# Patient Record
Sex: Male | Born: 1975 | Race: White | Hispanic: No | Marital: Married | Smoking: Former smoker
Health system: Southern US, Community
[De-identification: ages and names within clinical notes are randomized; demographics above are authoritative.]

## PROBLEM LIST (undated history)

## (undated) DIAGNOSIS — T7840XA Allergy, unspecified, initial encounter: Secondary | ICD-10-CM

## (undated) DIAGNOSIS — J45909 Unspecified asthma, uncomplicated: Secondary | ICD-10-CM

## (undated) HISTORY — DX: Allergy, unspecified, initial encounter: T78.40XA

---

## 2009-02-15 ENCOUNTER — Encounter: Admission: RE | Admit: 2009-02-15 | Discharge: 2009-02-15 | Payer: Self-pay | Admitting: Family Medicine

## 2010-06-09 ENCOUNTER — Encounter: Admission: RE | Admit: 2010-06-09 | Discharge: 2010-06-09 | Payer: Self-pay | Admitting: Occupational Medicine

## 2013-08-24 ENCOUNTER — Emergency Department (INDEPENDENT_AMBULATORY_CARE_PROVIDER_SITE_OTHER)

## 2013-08-24 ENCOUNTER — Emergency Department (INDEPENDENT_AMBULATORY_CARE_PROVIDER_SITE_OTHER)
Admission: EM | Admit: 2013-08-24 | Discharge: 2013-08-24 | Disposition: A | Discharge status: Home / Self Care | Source: Home / Self Care | Attending: Emergency Medicine | Admitting: Emergency Medicine

## 2013-08-24 ENCOUNTER — Encounter (HOSPITAL_COMMUNITY): Payer: Self-pay | Admitting: Emergency Medicine

## 2013-08-24 DIAGNOSIS — J45901 Unspecified asthma with (acute) exacerbation: Secondary | ICD-10-CM

## 2013-08-24 DIAGNOSIS — R69 Illness, unspecified: Principal | ICD-10-CM

## 2013-08-24 DIAGNOSIS — J111 Influenza due to unidentified influenza virus with other respiratory manifestations: Secondary | ICD-10-CM

## 2013-08-24 DIAGNOSIS — J189 Pneumonia, unspecified organism: Secondary | ICD-10-CM

## 2013-08-24 HISTORY — DX: Unspecified asthma, uncomplicated: J45.909

## 2013-08-24 MED ORDER — CEFTRIAXONE SODIUM 1 G IJ SOLR
1.0000 g | Freq: Once | INTRAMUSCULAR | Status: AC
  Administered 2013-08-24: 1 g via INTRAMUSCULAR

## 2013-08-24 MED ORDER — OSELTAMIVIR PHOSPHATE 75 MG PO CAPS: 75.0000 mg | ORAL_CAPSULE | Freq: Two times a day (BID) | ORAL | Status: DC

## 2013-08-24 MED ORDER — METHYLPREDNISOLONE ACETATE 80 MG/ML IJ SUSP
INTRAMUSCULAR | Status: AC
  Filled 2013-08-24: qty 1

## 2013-08-24 MED ORDER — HYDROCOD POLST-CHLORPHEN POLST 10-8 MG/5ML PO LQCR: 5.0000 mL | Freq: Two times a day (BID) | ORAL | Status: DC | PRN

## 2013-08-24 MED ORDER — ALBUTEROL SULFATE HFA 108 (90 BASE) MCG/ACT IN AERS: 1.0000 | INHALATION_SPRAY | Freq: Four times a day (QID) | RESPIRATORY_TRACT | Status: DC | PRN

## 2013-08-24 MED ORDER — ALBUTEROL SULFATE (2.5 MG/3ML) 0.083% IN NEBU
INHALATION_SOLUTION | RESPIRATORY_TRACT | Status: AC
  Filled 2013-08-24: qty 3

## 2013-08-24 MED ORDER — ALBUTEROL SULFATE (2.5 MG/3ML) 0.083% IN NEBU: 2.5000 mg | INHALATION_SOLUTION | Freq: Four times a day (QID) | RESPIRATORY_TRACT | Status: DC | PRN

## 2013-08-24 MED ORDER — IPRATROPIUM-ALBUTEROL 0.5-2.5 (3) MG/3ML IN SOLN
3.0000 mL | RESPIRATORY_TRACT | Status: DC
  Administered 2013-08-24: 3 mL via RESPIRATORY_TRACT

## 2013-08-24 MED ORDER — PREDNISONE 20 MG PO TABS: 20.0000 mg | ORAL_TABLET | Freq: Two times a day (BID) | ORAL | Status: DC

## 2013-08-24 MED ORDER — METHYLPREDNISOLONE ACETATE 80 MG/ML IJ SUSP
80.0000 mg | Freq: Once | INTRAMUSCULAR | Status: AC
  Administered 2013-08-24: 80 mg via INTRAMUSCULAR

## 2013-08-24 MED ORDER — AZITHROMYCIN 250 MG PO TABS: ORAL_TABLET | ORAL | Status: DC

## 2013-08-24 MED ORDER — CEFTRIAXONE SODIUM 1 G IJ SOLR
INTRAMUSCULAR | Status: AC
  Filled 2013-08-24: qty 10

## 2013-08-24 MED ORDER — IPRATROPIUM BROMIDE 0.02 % IN SOLN
RESPIRATORY_TRACT | Status: AC
  Filled 2013-08-24: qty 2.5

## 2013-08-24 NOTE — ED Notes (Signed)
Pt  Reports  Symptoms  Of  Body  Aches    With  Chills   sorethroat  And  Non       Productive  Cough  For   4   Days     He  Is  Smoker  And  Has  A  History  Of  Asthma

## 2013-08-24 NOTE — ED Provider Notes (Signed)
Chief Complaint   Chief Complaint  Patient presents with  . URI    History of Present Illness   Jonathan Collins is a 38 year old male with asthma who has had a five-day history of dry cough, chest tightness, wheezing, chest pain, myalgias, chills, subjective fever, sore throat, and posttussive vomiting. He's been exposed to several coworkers with the flu. He has not gotten the flu vaccine this year. He denies any nasal congestion, rhinorrhea, abdominal pain, or diarrhea. His asthma is mild and intermittent. He controls it with as needed doses of albuterol, however he is out of his medication right now.  Review of Systems   Other than as noted above, the patient denies any of the following symptoms: Systemic:  No fevers, chills, sweats, or myalgias. Eye:  No redness or discharge. ENT:  No ear pain, headache, nasal congestion, drainage, sinus pressure, or sore throat. Neck:  No neck pain, stiffness, or swollen glands. Lungs:  No cough, sputum production, hemoptysis, wheezing, chest tightness, shortness of breath or chest pain. GI:  No abdominal pain, nausea, vomiting or diarrhea.  PMFSH   Past medical history, family history, social history, meds, and allergies were reviewed. He's allergic to penicillin. He smokes an occasional cigarette.  Physical exam   Vital signs:  BP 130/80  Pulse 78  Temp(Src) 98.6 F (37 C) (Oral)  Resp 18  SpO2 96% General:  Alert and oriented.  In no distress.  Skin warm and dry. Eye:  No conjunctival injection or drainage. Lids were normal. ENT:  TMs and canals were normal, without erythema or inflammation.  Nasal mucosa was clear and uncongested, without drainage.  Mucous membranes were moist.  Pharynx was clear with no exudate or drainage.  There were no oral ulcerations or lesions. Neck:  Supple, no adenopathy, tenderness or mass. Lungs:  No respiratory distress.  Lungs were clear to auscultation, without wheezes, rales or rhonchi.  Breath sounds were  clear and equal bilaterally.  Heart:  Regular rhythm, without gallops, murmers or rubs. Skin:  Clear, warm, and dry, without rash or lesions.  Radiology   Dg Chest 2 View  08/24/2013   CLINICAL DATA:  Cough and fever  EXAM: CHEST  2 VIEW  COMPARISON:  June 09, 2010  FINDINGS: There is focal consolidation in the posterior segment of the right lower lobe. Elsewhere lungs are clear. Heart size and pulmonary vascularity are normal. No adenopathy. There is mild upper thoracic levoscoliosis.  IMPRESSION: Airspace consolidation in the posterior segment right lower lobe consistent with pneumonia. Followup study in 7-10 days to assess for clearing advised. Lungs otherwise clear.   Electronically Signed   By: Bretta BangWilliam  Woodruff M.D.   On: 08/24/2013 10:02   [facility]   He was given Depo-Medrol 80 mg IM and a DuoNeb breathing treatment. He felt better thereafter. Repeat auscultation of his lungs again reveals clear lungs. There were no wheezes before after the breathing treatment. For the pneumonia he was given Rocephin 1 g IM.  Assessment     The primary encounter diagnosis was Influenza-like illness. Diagnoses of Community acquired pneumonia and Asthma attack were also pertinent to this visit.  He will need followup here in 2-3 days and a repeat chest x-ray in one month.  Plan    1.  Meds:  The following meds were prescribed:   New Prescriptions   ALBUTEROL (PROVENTIL HFA;VENTOLIN HFA) 108 (90 BASE) MCG/ACT INHALER    Inhale 1-2 puffs into the lungs every 6 (  six) hours as needed for wheezing or shortness of breath.   ALBUTEROL (PROVENTIL) (2.5 MG/3ML) 0.083% NEBULIZER SOLUTION    Take 3 mLs (2.5 mg total) by nebulization every 6 (six) hours as needed for wheezing.   AZITHROMYCIN (ZITHROMAX Z-PAK) 250 MG TABLET    Take as directed.   CHLORPHENIRAMINE-HYDROCODONE (TUSSIONEX) 10-8 MG/5ML LQCR    Take 5 mLs by mouth every 12 (twelve) hours as needed for cough.   OSELTAMIVIR  (TAMIFLU) 75 MG CAPSULE    Take 1 capsule (75 mg total) by mouth every 12 (twelve) hours.   PREDNISONE (DELTASONE) 20 MG TABLET    Take 1 tablet (20 mg total) by mouth 2 (two) times daily.    2.  Patient Education/Counseling:  The patient was given appropriate handouts, self care instructions, and instructed in symptomatic relief.  Instructed to get extra fluids, rest, and use a cool mist vaporizer.    3.  Follow up:  The patient was told to follow up here in 2-3 days for the pneumonia, or sooner if becoming worse in any way, and given some red flag symptoms such as increasing fever, difficulty breathing, chest pain, or persistent vomiting which would prompt immediate return.  Follow up here in one month for a repeat chest x-ray to confirm clearing of the pneumonia.      Reuben Likes, MD 08/24/13 1040

## 2013-08-24 NOTE — Discharge Instructions (Signed)
Asthma Attack Prevention Although there is no way to prevent asthma from starting, you can take steps to control the disease and reduce its symptoms. Learn about your asthma and how to control it. Take an active role to control your asthma by working with your health care provider to create and follow an asthma action plan. An asthma action plan guides you in:  Taking your medicines properly.  Avoiding things that set off your asthma or make your asthma worse (asthma triggers).  Tracking your level of asthma control.  Responding to worsening asthma.  Seeking emergency care when needed. To track your asthma, keep records of your symptoms, check your peak flow number using a handheld device that shows how well air moves out of your lungs (peak flow meter), and get regular asthma checkups.  WHAT ARE SOME WAYS TO PREVENT AN ASTHMA ATTACK?  Take medicines as directed by your health care provider.  Keep track of your asthma symptoms and level of control.  With your health care provider, write a detailed plan for taking medicines and managing an asthma attack. Then be sure to follow your action plan. Asthma is an ongoing condition that needs regular monitoring and treatment.  Identify and avoid asthma triggers. Many outdoor allergens and irritants (such as pollen, mold, cold air, and air pollution) can trigger asthma attacks. Find out what your asthma triggers are and take steps to avoid them.  Monitor your breathing. Learn to recognize warning signs of an attack, such as coughing, wheezing, or shortness of breath. Your lung function may decrease before you notice any signs or symptoms, so regularly measure and record your peak airflow with a home peak flow meter.  Identify and treat attacks early. If you act quickly, you are less likely to have a severe attack. You will also need less medicine to control your symptoms. When your peak flow measurements decrease and alert you to an upcoming attack,  take your medicine as instructed and immediately stop any activity that may have triggered the attack. If your symptoms do not improve, get medical help.  Pay attention to increasing quick-relief inhaler use. If you find yourself relying on your quick-relief inhaler, your asthma is not under control. See your health care provider about adjusting your treatment. WHAT CAN MAKE MY SYMPTOMS WORSE? A number of common things can set off or make your asthma symptoms worse and cause temporary increased inflammation of your airways. Keep track of your asthma symptoms for several weeks, detailing all the environmental and emotional factors that are linked with your asthma. When you have an asthma attack, go back to your asthma diary to see which factor, or combination of factors, might have contributed to it. Once you know what these factors are, you can take steps to control many of them. If you have allergies and asthma, it is important to take asthma prevention steps at home. Minimizing contact with the substance to which you are allergic will help prevent an asthma attack. Some triggers and ways to avoid these triggers are: Animal Dander:  Some people are allergic to the flakes of skin or dried saliva from animals with fur or feathers.   There is no such thing as a hypoallergenic dog or cat breed. All dogs or cats can cause allergies, even if they don't shed.  Keep these pets out of your home.  If you are not able to keep a pet outdoors, keep the pet out of your bedroom and other sleeping areas at all  times, and keep the door closed.  Remove carpets and furniture covered with cloth from your home. If that is not possible, keep the pet away from fabric-covered furniture and carpets. Dust Mites: Many people with asthma are allergic to dust mites. Dust mites are tiny bugs that are found in every home in mattresses, pillows, carpets, fabric-covered furniture, bedcovers, clothes, stuffed toys, and other  fabric-covered items.   Cover your mattress in a special dust-proof cover.  Cover your pillow in a special dust-proof cover, or wash the pillow each week in hot water. Water must be hotter than 130 F (54.4 C) to kill dust mites. Cold or warm water used with detergent and bleach can also be effective.  Wash the sheets and blankets on your bed each week in hot water.  Try not to sleep or lie on cloth-covered cushions.  Call ahead when traveling and ask for a smoke-free hotel room. Bring your own bedding and pillows in case the hotel only supplies feather pillows and down comforters, which may contain dust mites and cause asthma symptoms.  Remove carpets from your bedroom and those laid on concrete, if you can.  Keep stuffed toys out of the bed, or wash the toys weekly in hot water or cooler water with detergent and bleach. Cockroaches: Many people with asthma are allergic to the droppings and remains of cockroaches.   Keep food and garbage in closed containers. Never leave food out.  Use poison baits, traps, powders, gels, or paste (for example, boric acid).  If a spray is used to kill cockroaches, stay out of the room until the odor goes away. Indoor Mold:  Fix leaky faucets, pipes, or other sources of water that have mold around them.  Clean floors and moldy surfaces with a fungicide or diluted bleach.  Avoid using humidifiers, vaporizers, or swamp coolers. These can spread molds through the air. Pollen and Outdoor Mold:  When pollen or mold spore counts are high, try to keep your windows closed.  Stay indoors with windows closed from late morning to afternoon. Pollen and some mold spore counts are highest at that time.  Ask your health care provider whether you need to take anti-inflammatory medicine or increase your dose of the medicine before your allergy season starts. Other Irritants to Avoid:  Tobacco smoke is an irritant. If you smoke, ask your health care provider how  you can quit. Ask family members to quit smoking too. Do not allow smoking in your home or car.  If possible, do not use a wood-burning stove, kerosene heater, or fireplace. Minimize exposure to all sources of smoke, including to incense, candles, fires, and fireworks.  Try to stay away from strong odors and sprays, such as perfume, talcum powder, hair spray, and paints.  Decrease humidity in your home and use an indoor air cleaning device. Reduce indoor humidity to below 60%. Dehumidifiers or central air conditioners can do this.  Decrease house dust exposure by changing furnace and air cooler filters frequently.  Try to have someone else vacuum for you once or twice a week. Stay out of rooms while they are being vacuumed and for a short while afterward.  If you vacuum, use a dust mask from a hardware store, a double-layered or microfilter vacuum cleaner bag, or a vacuum cleaner with a HEPA filter.  Sulfites in foods and beverages can be irritants. Do not drink beer or wine or eat dried fruit, processed potatoes, or shrimp if they cause asthma symptoms.  Cold air can trigger an asthma attack. Cover your nose and mouth with a scarf on cold or windy days.  Several health conditions can make asthma more difficult to manage, including a runny nose, sinus infections, reflux disease, psychological stress, and sleep apnea. Work with your health care provider to manage these conditions.  Avoid close contact with people who have a respiratory infection such as a cold or the flu, since your asthma symptoms may get worse if you catch the infection. Wash your hands thoroughly after touching items that may have been handled by people with a respiratory infection.  Get a flu shot every year to protect against the flu virus, which often makes asthma worse for days or weeks. Also get a pneumonia shot if you have not previously had one. Unlike the flu shot, the pneumonia shot does not need to be given  yearly. Medicines:  Talk to your health care provider about whether it is safe for you to take aspirin or non-steroidal anti-inflammatory medicines (NSAIDs). In a small number of people with asthma, aspirin and NSAIDs can cause asthma attacks. These medicines must be avoided by people who have known aspirin-sensitive asthma. It is important that people with aspirin-sensitive asthma read labels of all over-the-counter medicines used to treat pain, colds, coughs, and fever.  Beta blockers and ACE inhibitors are other medicines you should discuss with your health care provider. HOW CAN I FIND OUT WHAT I AM ALLERGIC TO? Ask your asthma health care provider about allergy skin testing or blood testing (the RAST test) to identify the allergens to which you are sensitive. If you are found to have allergies, the most important thing to do is to try to avoid exposure to any allergens that you are sensitive to as much as possible. Other treatments for allergies, such as medicines and allergy shots (immunotherapy) are available.  CAN I EXERCISE? Follow your health care provider's advice regarding asthma treatment before exercising. It is important to maintain a regular exercise program, but vigorous exercise, or exercise in cold, humid, or dry environments can cause asthma attacks, especially for those people who have exercise-induced asthma. Document Released: 06/10/2009 Document Revised: 02/22/2013 Document Reviewed: 12/28/2012 [facility] Patient Information 2014 [place], [place].  Pneumonia, Adult Pneumonia is an infection of the lungs.  CAUSES Pneumonia may be caused by bacteria or a virus. Usually, these infections are caused by breathing infectious particles into the lungs (respiratory tract). SYMPTOMS   Cough.  Fever.  Chest pain.  Increased rate of breathing.  Wheezing.  Mucus production. DIAGNOSIS  If you have the common symptoms of pneumonia, your caregiver will typically confirm the  diagnosis with a chest X-ray. The X-ray will show an abnormality in the lung (pulmonary infiltrate) if you have pneumonia. Other tests of your blood, urine, or sputum may be done to find the specific cause of your pneumonia. Your caregiver may also do tests (blood gases or pulse oximetry) to see how well your lungs are working. TREATMENT  Some forms of pneumonia may be spread to other people when you cough or sneeze. You may be asked to wear a mask before and during your exam. Pneumonia that is caused by bacteria is treated with antibiotic medicine. Pneumonia that is caused by the influenza virus may be treated with an antiviral medicine. Most other viral infections must run their course. These infections will not respond to antibiotics.  PREVENTION A pneumococcal shot (vaccine) is available to prevent a common bacterial cause of pneumonia. This is usually  suggested for:  People over 36 years old.  Patients on chemotherapy.  People with chronic lung problems, such as bronchitis or emphysema.  People with immune system problems. If you are over 65 or have a high risk condition, you may receive the pneumococcal vaccine if you have not received it before. In some countries, a routine influenza vaccine is also recommended. This vaccine can help prevent some cases of pneumonia.You may be offered the influenza vaccine as part of your care. If you smoke, it is time to quit. You may receive instructions on how to stop smoking. Your caregiver can provide medicines and counseling to help you quit. HOME CARE INSTRUCTIONS   Cough suppressants may be used if you are losing too much rest. However, coughing protects you by clearing your lungs. You should avoid using cough suppressants if you can.  Your caregiver may have prescribed medicine if he or she thinks your pneumonia is caused by a bacteria or influenza. Finish your medicine even if you start to feel better.  Your caregiver may also prescribe an  expectorant. This loosens the mucus to be coughed up.  Only take over-the-counter or prescription medicines for pain, discomfort, or fever as directed by your caregiver.  Do not smoke. Smoking is a common cause of bronchitis and can contribute to pneumonia. If you are a smoker and continue to smoke, your cough may last several weeks after your pneumonia has cleared.  A cold steam vaporizer or humidifier in your room or home may help loosen mucus.  Coughing is often worse at night. Sleeping in a semi-upright position in a recliner or using a couple pillows under your head will help with this.  Get rest as you feel it is needed. Your body will usually let you know when you need to rest. SEEK IMMEDIATE MEDICAL CARE IF:   Your illness becomes worse. This is especially true if you are elderly or weakened from any other disease.  You cannot control your cough with suppressants and are losing sleep.  You begin coughing up blood.  You develop pain which is getting worse or is uncontrolled with medicines.  You have a fever.  Any of the symptoms which initially brought you in for treatment are getting worse rather than better.  You develop shortness of breath or chest pain. MAKE SURE YOU:   Understand these instructions.  Will watch your condition.  Will get help right away if you are not doing well or get worse. Document Released: 06/22/2005 Document Revised: 09/14/2011 Document Reviewed: 09/11/2010 [facility] Patient Information 2014 [place]

## 2014-06-13 ENCOUNTER — Encounter (HOSPITAL_COMMUNITY): Payer: Self-pay | Admitting: Emergency Medicine

## 2014-06-13 ENCOUNTER — Emergency Department (HOSPITAL_COMMUNITY)
Admission: EM | Admit: 2014-06-13 | Discharge: 2014-06-13 | Discharge status: Against Medical Advice | Attending: Emergency Medicine | Admitting: Emergency Medicine

## 2014-06-13 ENCOUNTER — Emergency Department (HOSPITAL_COMMUNITY)

## 2014-06-13 DIAGNOSIS — R05 Cough: Secondary | ICD-10-CM | POA: Diagnosis not present

## 2014-06-13 DIAGNOSIS — Z720 Tobacco use: Secondary | ICD-10-CM | POA: Insufficient documentation

## 2014-06-13 DIAGNOSIS — R079 Chest pain, unspecified: Secondary | ICD-10-CM

## 2014-06-13 DIAGNOSIS — J45909 Unspecified asthma, uncomplicated: Secondary | ICD-10-CM | POA: Diagnosis not present

## 2014-06-13 DIAGNOSIS — R1110 Vomiting, unspecified: Secondary | ICD-10-CM | POA: Diagnosis not present

## 2014-06-13 LAB — COMPREHENSIVE METABOLIC PANEL
ALT: 27 U/L (ref 0–53)
ANION GAP: 14 (ref 5–15)
AST: 16 U/L (ref 0–37)
Albumin: 4.2 g/dL (ref 3.5–5.2)
Alkaline Phosphatase: 70 U/L (ref 39–117)
BUN: 11 mg/dL (ref 6–23)
CALCIUM: 10.2 mg/dL (ref 8.4–10.5)
CO2: 25 mEq/L (ref 19–32)
Chloride: 96 mEq/L (ref 96–112)
Creatinine, Ser: 1.2 mg/dL (ref 0.50–1.35)
GFR calc non Af Amer: 76 mL/min — ABNORMAL LOW (ref >90)
GFR, EST AFRICAN AMERICAN: 89 mL/min — AB (ref >90)
GLUCOSE: 98 mg/dL (ref 70–99)
Potassium: 3.9 mEq/L (ref 3.7–5.3)
Sodium: 135 mEq/L — ABNORMAL LOW (ref 137–147)
TOTAL PROTEIN: 7.9 g/dL (ref 6.0–8.3)
Total Bilirubin: 0.3 mg/dL (ref 0.3–1.2)

## 2014-06-13 LAB — CBC WITH DIFFERENTIAL/PLATELET
Basophils Absolute: 0 10*3/uL (ref 0.0–0.1)
Basophils Relative: 0 % (ref 0–1)
EOS ABS: 0.3 10*3/uL (ref 0.0–0.7)
EOS PCT: 4 % (ref 0–5)
HCT: 44.3 % (ref 39.0–52.0)
HEMOGLOBIN: 16 g/dL (ref 13.0–17.0)
LYMPHS ABS: 1.6 10*3/uL (ref 0.7–4.0)
Lymphocytes Relative: 18 % (ref 12–46)
MCH: 33 pg (ref 26.0–34.0)
MCHC: 36.1 g/dL — ABNORMAL HIGH (ref 30.0–36.0)
MCV: 91.3 fL (ref 78.0–100.0)
MONOS PCT: 6 % (ref 3–12)
Monocytes Absolute: 0.6 10*3/uL (ref 0.1–1.0)
Neutro Abs: 6.7 10*3/uL (ref 1.7–7.7)
Neutrophils Relative %: 72 % (ref 43–77)
Platelets: 225 10*3/uL (ref 150–400)
RBC: 4.85 MIL/uL (ref 4.22–5.81)
RDW: 12.3 % (ref 11.5–15.5)
WBC: 9.2 10*3/uL (ref 4.0–10.5)

## 2014-06-13 NOTE — ED Notes (Signed)
Pt sts he could not stay any longer; pt sts to come back tomorrow

## 2014-06-13 NOTE — ED Notes (Signed)
Pt c/o URI sx with cough x several days; pt sts CP and tightness x 2 days with N/V starting during the night

## 2014-06-14 ENCOUNTER — Ambulatory Visit (INDEPENDENT_AMBULATORY_CARE_PROVIDER_SITE_OTHER): Admitting: Family Medicine

## 2014-06-14 VITALS — BP 120/86 | HR 92 | Temp 98.1°F | Resp 18 | Ht 76.0 in | Wt 273.0 lb

## 2014-06-14 DIAGNOSIS — J069 Acute upper respiratory infection, unspecified: Secondary | ICD-10-CM

## 2014-06-14 DIAGNOSIS — J4521 Mild intermittent asthma with (acute) exacerbation: Secondary | ICD-10-CM

## 2014-06-14 DIAGNOSIS — R0789 Other chest pain: Secondary | ICD-10-CM

## 2014-06-14 LAB — POCT CBC
GRANULOCYTE PERCENT: 62.8 % (ref 37–80)
HEMATOCRIT: 43.8 % (ref 43.5–53.7)
Hemoglobin: 14.8 g/dL (ref 14.1–18.1)
LYMPH, POC: 3 (ref 0.6–3.4)
MCH, POC: 31.5 pg — AB (ref 27–31.2)
MCHC: 33.7 g/dL (ref 31.8–35.4)
MCV: 93.5 fL (ref 80–97)
MID (cbc): 0.8 (ref 0–0.9)
MPV: 7 fL (ref 0–99.8)
POC Granulocyte: 6.3 (ref 2–6.9)
POC LYMPH %: 29.6 % (ref 10–50)
POC MID %: 7.6 %M (ref 0–12)
Platelet Count, POC: 247 10*3/uL (ref 142–424)
RBC: 4.69 M/uL (ref 4.69–6.13)
RDW, POC: 13 %
WBC: 10 10*3/uL (ref 4.6–10.2)

## 2014-06-14 LAB — I-STAT TROPONIN, ED: Troponin i, poc: 0 ng/mL (ref 0.00–0.08)

## 2014-06-14 MED ORDER — ALBUTEROL SULFATE HFA 108 (90 BASE) MCG/ACT IN AERS: 1.0000 | INHALATION_SPRAY | Freq: Four times a day (QID) | RESPIRATORY_TRACT | Status: DC | PRN

## 2014-06-14 NOTE — Progress Notes (Signed)
Subjective:    Patient ID: Jonathan Collins, male    DOB: Jun 29, 1976, 38 y.o.   MRN: [id number]  This chart was scribed for Ethelda ChickKristi M Beth Goodlin, MD by Gwenyth Oberatherine Macek, ED Scribe. This patient was seen at [facility] and the patient's care was started at 7:45 PM.   06/14/2014  Hospitalization Follow-up; Hypertension; and rx refills  HPI  HPI Comments: Jonathan Collins is a 38 y.o. male with a history of asthma and allergies who [facility] complaining of constant, waxing and waning, non-radiating chest pain and chest tightness that started yesterday and occurs mostly with exertion. Pt states that he has had subjective fever and headache that started 4 days ago and are accompanied by nasal congestion, rhinorrhea, cough, and SOB as associated symptoms. Pt also reports nausea, sore throat and multiple episodes of vomiting that occurred yesterday. He has not tried any treatment PTA. Pt notes that symptoms become worse with lying down. He has a history of asthma, but denies a history of similar CP; current chest pain feels very different from typical asthma related chest tightness. Pt states pain started while he was lying in bed and became worse today when he was walking up stairs. He was in the ED yesterday with a cough and cold symptoms accompanied by chest and chest tightness for 2 days, but left without being seen. He did have lab work and a chest x-ray, which were unremarkable. His troponin measured 0.  Pt denies a family history of CAD and MI, but notes his sister has a heart defect. He smokes 3 cigarettes daily and drinks 3-4 bottles of beer weekly. He denies positive sick contacts. Pt also denies diarrhea, diaphoresis, abdominal pain and dizziness as associated symptoms.  Admits to a lot of stress lately; just reconciled with wife and has moved back home today.  Review of Systems  Constitutional: Positive for fever. Negative for diaphoresis.  HENT: Positive for congestion,  rhinorrhea and sore throat. Negative for ear pain, trouble swallowing and voice change.   Respiratory: Positive for cough. Negative for shortness of breath, wheezing and stridor.   Cardiovascular: Positive for chest pain. Negative for palpitations and leg swelling.  Gastrointestinal: Positive for nausea and vomiting. Negative for abdominal pain and diarrhea.  Skin: Negative for rash.  Neurological: Negative for dizziness, light-headedness and headaches.     Past Medical History  Diagnosis Date  . Asthma   . Allergy    History reviewed. No pertinent past surgical history. Allergies  Allergen Reactions  . Penicillins    Current Outpatient Prescriptions  Medication Sig Dispense Refill  . albuterol (PROVENTIL HFA;VENTOLIN HFA) 108 (90 BASE) MCG/ACT inhaler Inhale 1-2 puffs into the lungs every 6 (six) hours as needed for wheezing or shortness of breath. 1 Inhaler 2   No current facility-administered medications for this visit.       Objective:    BP 120/86 mmHg  Pulse 92  Temp(Src) 98.1 F (36.7 C) (Oral)  Resp 18  Ht 6\' 4"  (1.93 m)  Wt 273 lb (123.832 kg)  BMI 33.24 kg/m2  SpO2 97% Physical Exam  Constitutional: He is oriented to person, place, and time. He appears well-developed and well-nourished. No distress.  HENT:  Head: Normocephalic and atraumatic.  Right Ear: External ear normal.  Left Ear: External ear normal.  Nose: Nose normal.  Mouth/Throat: Oropharynx is clear and moist.  Eyes: Conjunctivae and EOM are normal. Pupils are equal, round, and reactive to light.  Neck: Normal  range of motion. Neck supple. Carotid bruit is not present. No thyromegaly present.  Cardiovascular: Normal rate, regular rhythm, normal heart sounds and intact distal pulses.  Exam reveals no gallop and no friction rub.   No murmur heard. Pulmonary/Chest: Effort normal and breath sounds normal. He has no wheezes. He has no rales. He exhibits no tenderness.  Abdominal: Soft. Bowel sounds  are normal. He exhibits no distension and no mass. There is no tenderness. There is no rebound and no guarding.  Lymphadenopathy:    He has no cervical adenopathy.  Neurological: He is alert and oriented to person, place, and time. No cranial nerve deficit.  Skin: Skin is warm and dry. No rash noted. He is not diaphoretic.  Psychiatric: He has a normal mood and affect. His behavior is normal.  Nursing note and vitals reviewed.  Results for orders placed or performed in visit on 06/14/14  POCT CBC  Result Value Ref Range   WBC 10.0 4.6 - 10.2 K/uL   Lymph, poc 3.0 0.6 - 3.4   POC LYMPH PERCENT 29.6 10 - 50 %L   MID (cbc) 0.8 0 - 0.9   POC MID % 7.6 0 - 12 %M   POC Granulocyte 6.3 2 - 6.9   Granulocyte percent 62.8 37 - 80 %G   RBC 4.69 4.69 - 6.13 M/uL   Hemoglobin 14.8 14.1 - 18.1 g/dL   HCT, POC 16.143.8 09.643.5 - 53.7 %   MCV 93.5 80 - 97 fL   MCH, POC 31.5 (A) 27 - 31.2 pg   MCHC 33.7 31.8 - 35.4 g/dL   RDW, POC 04.513.0 %   Platelet Count, POC 247 142 - 424 K/uL   MPV 7.0 0 - 99.8 fL   EKG: NSR; no ST changes.    Assessment & Plan:   1. Other chest pain   2. Acute upper respiratory infection   3. Asthma with acute exacerbation, mild intermittent      1. Chest pain:  New.  Worsens with exertional component.  Recommend transport via EMS to ED and pt refuses; pt also refuses evaluation in ED tonight.  Advised that despite normal EKG and negative troponin yesterday, warrants one last troponin to rule out acute cardiac issue; pt refused and left AMA. 2.  URI: New.  Likely etiology to chest pain; CXR yesterday negative and CBC today remains normal.  Recommend supportive care with rest, fluids, Mucinex DM. 3.  Asthma with mild exacerbation: New.  Refill of Albuterol provided.    Meds ordered this encounter  Medications  . albuterol (PROVENTIL HFA;VENTOLIN HFA) 108 (90 BASE) MCG/ACT inhaler    Sig: Inhale 1-2 puffs into the lungs every 6 (six) hours as needed for wheezing or shortness  of breath.    Dispense:  1 Inhaler    Refill:  2    PROVENTIL    No Follow-up on file.    I personally performed the services described in this documentation, which was scribed in my presence. The recorded information has been reviewed and considered.  Nilda SimmerKristi Naiyah Klostermann, M.D.  [facility]  [place] [address] [place], [place]  [id number] [phone] phone [phone] fax

## 2014-06-14 NOTE — Patient Instructions (Signed)
1.  RECOMMEND PRESENTING TO EMERGENCY DEPARTMENT IMMEDIATELY.    Chest Pain (Nonspecific) It is often hard to give a specific diagnosis for the cause of chest pain. There is always a chance that your pain could be related to something serious, such as a heart attack or a blood clot in the lungs. You need to follow up with your health care provider for further evaluation. CAUSES   Heartburn.  Pneumonia or bronchitis.  Anxiety or stress.  Inflammation around your heart (pericarditis) or lung (pleuritis or pleurisy).  A blood clot in the lung.  A collapsed lung (pneumothorax). It can develop suddenly on its own (spontaneous pneumothorax) or from trauma to the chest.  Shingles infection (herpes zoster virus). The chest wall is composed of bones, muscles, and cartilage. Any of these can be the source of the pain.  The bones can be bruised by injury.  The muscles or cartilage can be strained by coughing or overwork.  The cartilage can be affected by inflammation and become sore (costochondritis). DIAGNOSIS  Lab tests or other studies may be needed to find the cause of your pain. Your health care provider may have you take a test called an ambulatory electrocardiogram (ECG). An ECG records your heartbeat patterns over a 24-hour period. You may also have other tests, such as:  Transthoracic echocardiogram (TTE). During echocardiography, sound waves are used to evaluate how blood flows through your heart.  Transesophageal echocardiogram (TEE).  Cardiac monitoring. This allows your health care provider to monitor your heart rate and rhythm in real time.  Holter monitor. This is a portable device that records your heartbeat and can help diagnose heart arrhythmias. It allows your health care provider to track your heart activity for several days, if needed.  Stress tests by exercise or by giving medicine that makes the heart beat faster. TREATMENT   Treatment depends on what may be  causing your chest pain. Treatment may include:  Acid blockers for heartburn.  Anti-inflammatory medicine.  Pain medicine for inflammatory conditions.  Antibiotics if an infection is present.  You may be advised to change lifestyle habits. This includes stopping smoking and avoiding alcohol, caffeine, and chocolate.  You may be advised to keep your head raised (elevated) when sleeping. This reduces the chance of acid going backward from your stomach into your esophagus. Most of the time, nonspecific chest pain will improve within 2-3 days with rest and mild pain medicine.  HOME CARE INSTRUCTIONS   If antibiotics were prescribed, take them as directed. Finish them even if you start to feel better.  For the next few days, avoid physical activities that bring on chest pain. Continue physical activities as directed.  Do not use any tobacco products, including cigarettes, chewing tobacco, or electronic cigarettes.  Avoid drinking alcohol.  Only take medicine as directed by your health care provider.  Follow your health care provider's suggestions for further testing if your chest pain does not go away.  Keep any follow-up appointments you made. If you do not go to an appointment, you could develop lasting (chronic) problems with pain. If there is any problem keeping an appointment, call to reschedule. SEEK MEDICAL CARE IF:   Your chest pain does not go away, even after treatment.  You have a rash with blisters on your chest.  You have a fever. SEEK IMMEDIATE MEDICAL CARE IF:   You have increased chest pain or pain that spreads to your arm, neck, jaw, back, or abdomen.  You have shortness  of breath.  You have an increasing cough, or you cough up blood.  You have severe back or abdominal pain.  You feel nauseous or vomit.  You have severe weakness.  You faint.  You have chills. This is an emergency. Do not wait to see if the pain will go away. Get medical help at once.  Call your local emergency services (911 in [place] Do not drive yourself to the hospital. MAKE SURE YOU:   Understand these instructions.  Will watch your condition.  Will get help right away if you are not doing well or get worse. Document Released: 04/01/2005 Document Revised: 06/27/2013 Document Reviewed: 01/26/2008 [employer] Patient Information 2015 [employer] [place]. This information is not intended to replace advice given to you by your health care provider. Make sure you discuss any questions you have with your health care provider.

## 2014-09-19 ENCOUNTER — Other Ambulatory Visit: Payer: Self-pay

## 2014-09-19 DIAGNOSIS — J4521 Mild intermittent asthma with (acute) exacerbation: Secondary | ICD-10-CM

## 2014-09-19 MED ORDER — ALBUTEROL SULFATE HFA 108 (90 BASE) MCG/ACT IN AERS: 1.0000 | INHALATION_SPRAY | Freq: Four times a day (QID) | RESPIRATORY_TRACT | Status: DC | PRN

## 2014-11-08 ENCOUNTER — Other Ambulatory Visit: Payer: Self-pay | Admitting: Family Medicine

## 2015-09-07 IMAGING — DX DG CHEST 2V
2 series · 2 of 2 positions shown · non-contrast
Comparison: 08/24/2013

CLINICAL DATA: Cough, fever, stuffiness per 2 days. Vomiting began
today. Chest tightness today.

EXAM:
CHEST  2 VIEW

[chest pa]
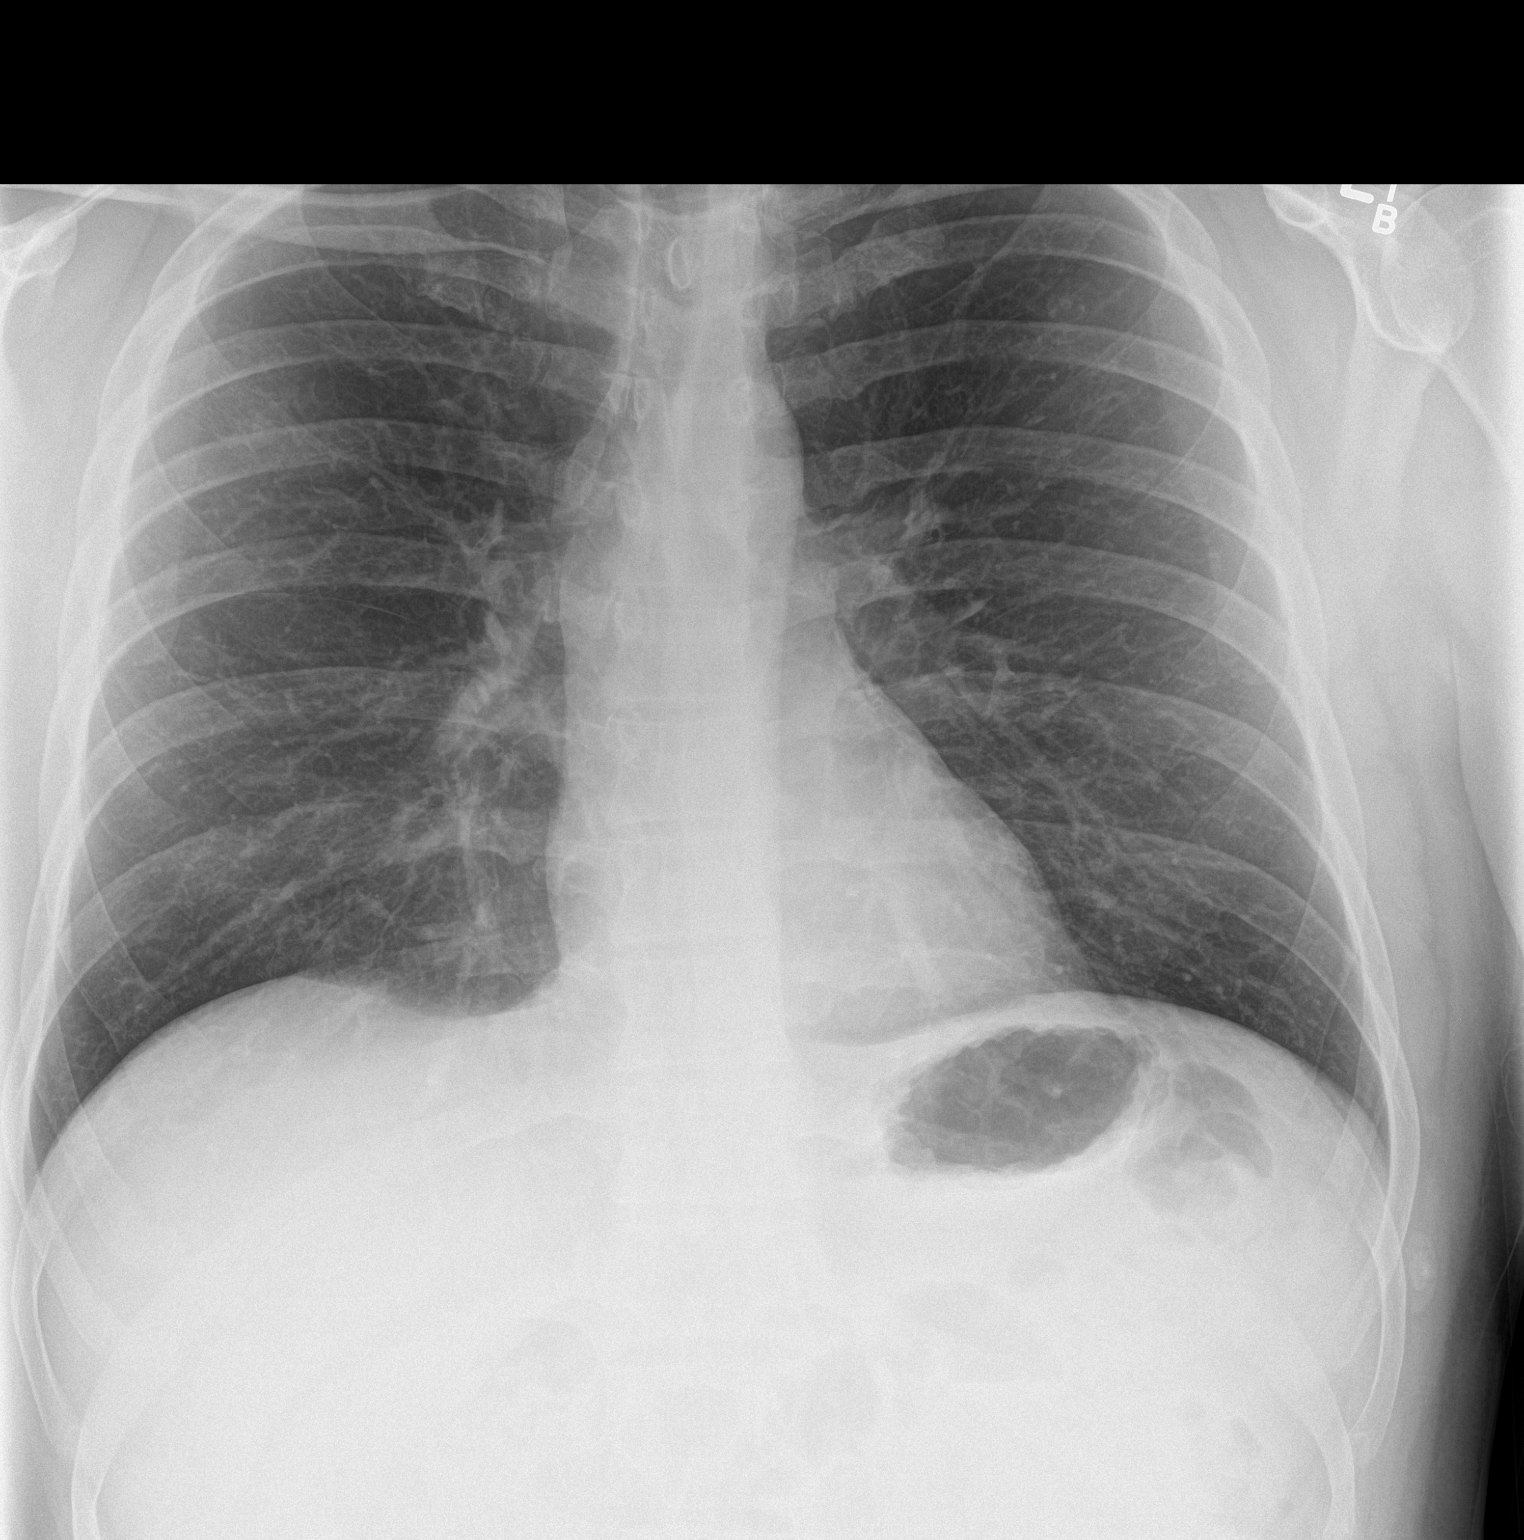

[chest lat]
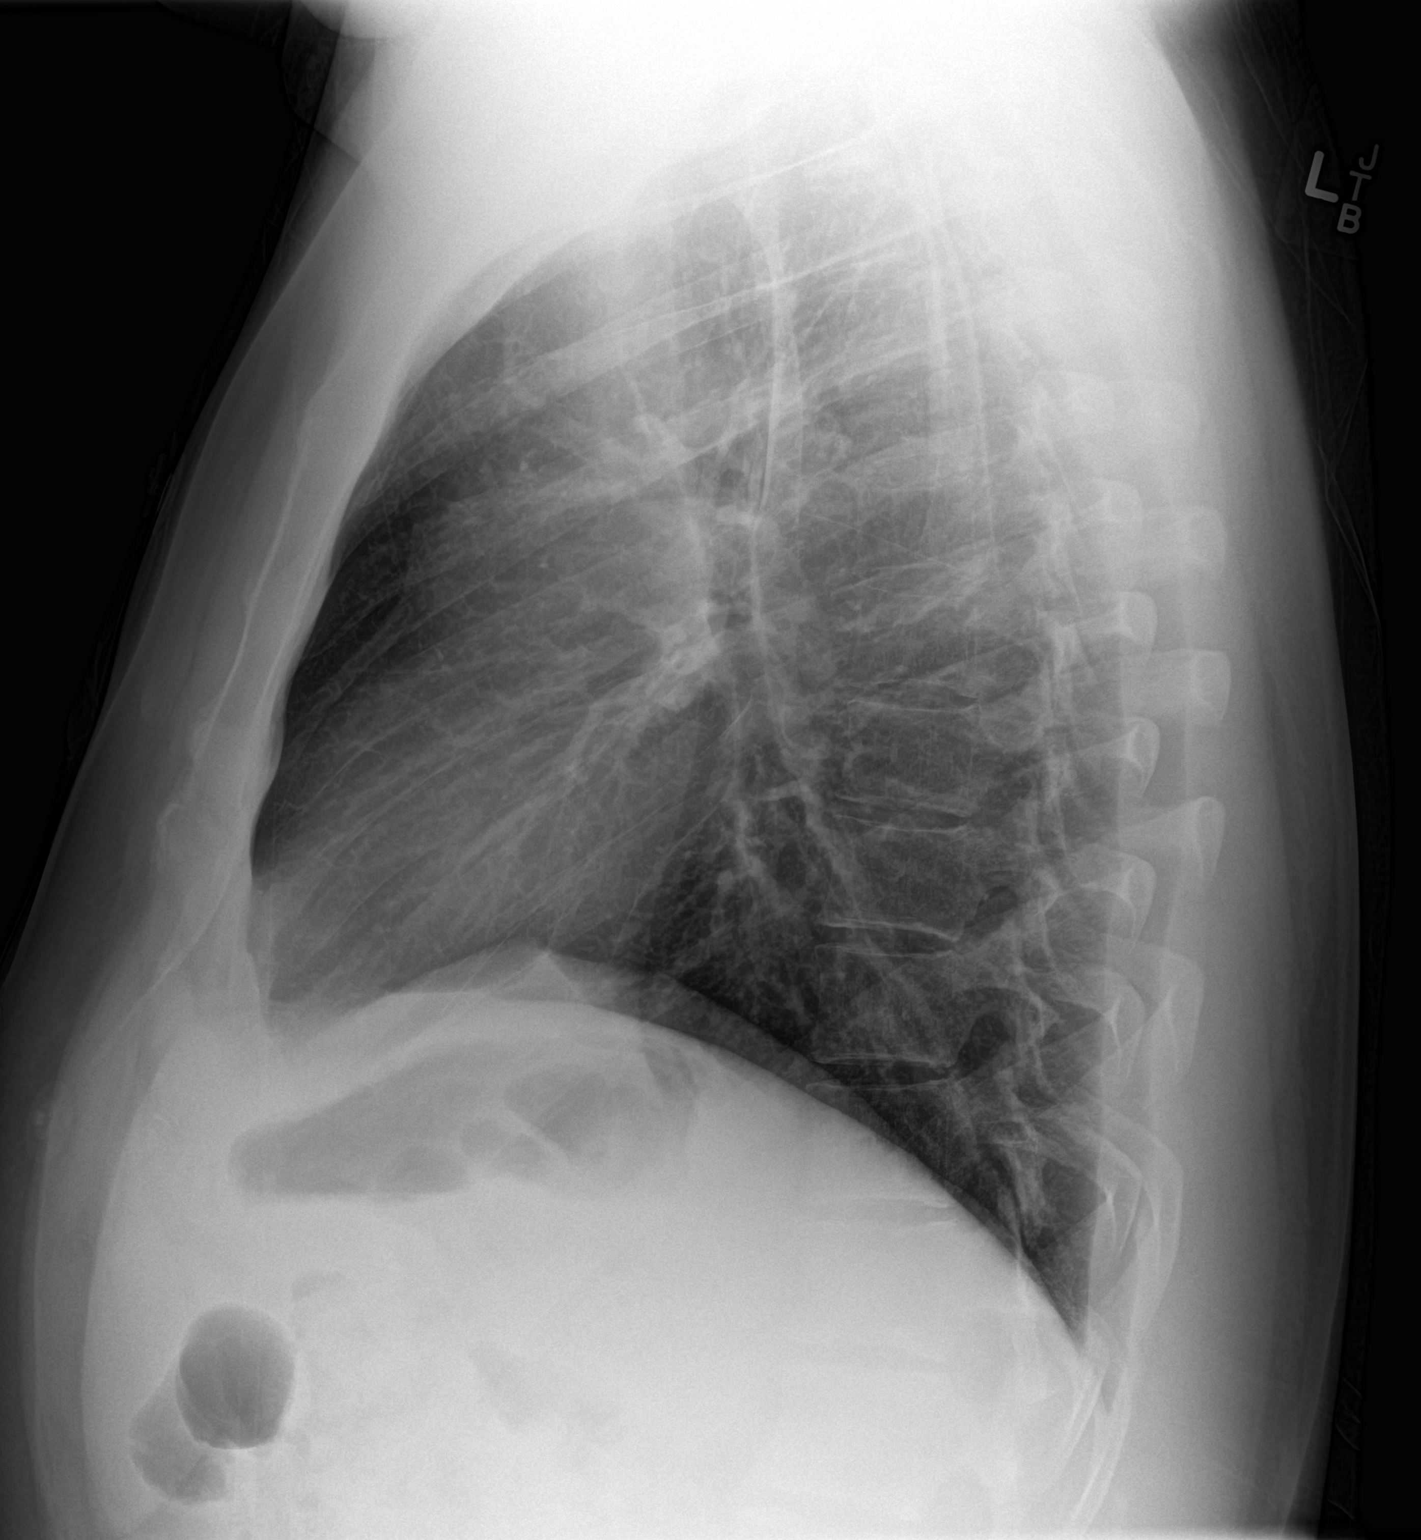

[2 of 2 positions shown; findings below may reference images not displayed]

FINDINGS: The heart size and mediastinal contours are within normal limits.
Both lungs are clear. The visualized skeletal structures are
unremarkable.
IMPRESSION: No active cardiopulmonary disease.

## 2018-11-16 ENCOUNTER — Other Ambulatory Visit: Payer: Self-pay | Admitting: Podiatry

## 2018-11-16 ENCOUNTER — Encounter: Payer: Self-pay | Admitting: Podiatry

## 2018-11-16 ENCOUNTER — Ambulatory Visit (INDEPENDENT_AMBULATORY_CARE_PROVIDER_SITE_OTHER): Payer: BLUE CROSS/BLUE SHIELD

## 2018-11-16 ENCOUNTER — Ambulatory Visit (INDEPENDENT_AMBULATORY_CARE_PROVIDER_SITE_OTHER): Payer: BLUE CROSS/BLUE SHIELD | Admitting: Podiatry

## 2018-11-16 ENCOUNTER — Other Ambulatory Visit: Payer: Self-pay

## 2018-11-16 VITALS — BP 129/97 | Temp 97.5°F

## 2018-11-16 DIAGNOSIS — B079 Viral wart, unspecified: Secondary | ICD-10-CM | POA: Diagnosis not present

## 2018-11-16 DIAGNOSIS — M79671 Pain in right foot: Secondary | ICD-10-CM

## 2018-11-16 DIAGNOSIS — M779 Enthesopathy, unspecified: Secondary | ICD-10-CM

## 2018-11-16 NOTE — Progress Notes (Signed)
Subjective:   Patient ID: Jonathan Collins, male   DOB: 43 y.o.   MRN: [id number]   HPI Patient states he has had this very painful lesion underneath his right foot and it is been going on for about 4 months and he is tried over-the-counter medicines without relief and is not sure if he may have stepped on something.  Patient does not smoke likes to be active   Review of Systems  All other systems reviewed and are negative.       Objective:  Physical Exam Vitals signs and nursing note reviewed.  Constitutional:      Appearance: He is well-developed.  Pulmonary:     Effort: Pulmonary effort is normal.  Musculoskeletal: Normal range of motion.  Skin:    General: Skin is warm.  Neurological:     Mental Status: He is alert.     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  I injected 60 mg Xylocaine with epinephrine sterile prep applied and using sterile instrumentation I removed the mass measuring about 1.2 cm x 1 cm I applied phenol to the base and applied sterile dressing and advised on elevation compression and sent off a pathological evaluation.  Patient will be seen back to recheck and understands recovery may take upwards of 1 month

## 2018-11-16 NOTE — Patient Instructions (Signed)

## 2020-09-18 ENCOUNTER — Ambulatory Visit: Payer: BLUE CROSS/BLUE SHIELD | Admitting: Podiatry

## 2020-09-18 ENCOUNTER — Other Ambulatory Visit: Payer: Self-pay

## 2020-09-18 ENCOUNTER — Encounter: Payer: Self-pay | Admitting: Podiatry

## 2020-09-18 DIAGNOSIS — B079 Viral wart, unspecified: Secondary | ICD-10-CM

## 2020-09-18 MED ORDER — FLUOROURACIL 5 % EX CREA: TOPICAL_CREAM | Freq: Two times a day (BID) | CUTANEOUS | 2 refills | Status: AC

## 2020-09-18 MED ORDER — FLUOROURACIL 5 % EX CREA: TOPICAL_CREAM | Freq: Two times a day (BID) | CUTANEOUS | 2 refills | Status: DC

## 2020-09-18 NOTE — Progress Notes (Signed)
Subjective:   Patient ID: Jonathan Collins, male   DOB: 45 y.o.   MRN: [id number]   HPI Patient states he has had 2 new spots that popped up on the bottom of his right matted first metatarsal and they are not as painful as originally and the ones removed it done very well   ROS      Objective:  Physical Exam  Neurovascular status intact with keratotic lesion x2 subfirst interspace plantar that are painful when pressed     Assessment:  Plantaris plantar right     Plan:  Reviewed and recommended debridement which was done today with pinpoint bleeding and I went ahead and applied chemical agent to create immune response sterile dressing and placed on Efudex and discussed we could cut them out if they remain resistant in the future.  Explained what to do if any blistering were to occur

## 2020-10-14 ENCOUNTER — Ambulatory Visit: Payer: BC Managed Care – PPO | Admitting: Podiatry

## 2020-12-05 ENCOUNTER — Ambulatory Visit: Payer: BC Managed Care – PPO | Admitting: Podiatry

## 2020-12-27 ENCOUNTER — Ambulatory Visit: Payer: BC Managed Care – PPO | Admitting: Podiatry

## 2020-12-27 ENCOUNTER — Other Ambulatory Visit: Payer: Self-pay

## 2020-12-27 ENCOUNTER — Encounter: Payer: Self-pay | Admitting: Podiatry

## 2020-12-27 DIAGNOSIS — B079 Viral wart, unspecified: Secondary | ICD-10-CM

## 2020-12-29 NOTE — Progress Notes (Signed)
Subjective:   Patient ID: Jonathan Collins, male   DOB: 45 y.o.   MRN: [id number]   HPI Patient has severe lesion formation plantar aspect right distal foot that has been very painful and have not responded to conservative treatment   ROS      Objective:  Physical Exam  Neuro vascular status intact with patient found to have large keratotic lesions of fourth interspace right painful when pressed with pain to go lateral pressure and direct pressure with pinpoint bleeding     Assessment:  Strong probability for verruca plantaris plantar right     Plan:  Discussed removal patient wants these done I explained procedure risk and today I infiltrated 60 mg like it Xylocaine epinephrine and sterile prep done and using sterile sharp dissection I curettaged a approximate 1.5 cm x 1.2 cm mass plantar aspect right distal foot placed in formalin for analysis and applied sterile dressing.  Instructed on soaks reappoint and explained I will let him know when we get results of pathology     Patient presents

## 2021-02-06 ENCOUNTER — Telehealth: Payer: Self-pay | Admitting: Podiatry

## 2021-02-06 NOTE — Telephone Encounter (Signed)
Patient called and stated that his heel has pain from putting weight on it from the pain from the wart that he had previously. Seems like more of his weight was on his heel. He wanted to know what he could do or does he need to come in for an appointment.

## 2021-02-07 NOTE — Telephone Encounter (Signed)
It has been 6 weeks. Better if he comes in

## 2021-02-12 ENCOUNTER — Encounter: Payer: Self-pay | Admitting: Podiatry

## 2021-02-12 ENCOUNTER — Ambulatory Visit (INDEPENDENT_AMBULATORY_CARE_PROVIDER_SITE_OTHER): Payer: BC Managed Care – PPO

## 2021-02-12 ENCOUNTER — Other Ambulatory Visit: Payer: Self-pay

## 2021-02-12 ENCOUNTER — Ambulatory Visit: Payer: BC Managed Care – PPO | Admitting: Podiatry

## 2021-02-12 DIAGNOSIS — M722 Plantar fascial fibromatosis: Secondary | ICD-10-CM

## 2021-02-12 MED ORDER — TRIAMCINOLONE ACETONIDE 10 MG/ML IJ SUSP
10.0000 mg | Freq: Once | INTRAMUSCULAR | Status: AC
Start: 2021-02-12 — End: 2021-02-12
  Administered 2021-02-12: 10 mg

## 2021-02-12 MED ORDER — DICLOFENAC SODIUM 75 MG PO TBEC: 75.0000 mg | DELAYED_RELEASE_TABLET | Freq: Two times a day (BID) | ORAL | 2 refills | Status: AC

## 2021-02-13 NOTE — Progress Notes (Signed)
Subjective:   Patient ID: Jonathan Collins, male   DOB: 45 y.o.   MRN: [id number]   HPI Patient states he is developed a lot of pain in the bottom of his right heel and this just started in the last 4 to 6 weeks.  Very hard when he tries to walk on it   ROS      Objective:  Physical Exam  Neurovascular status intact exquisite discomfort plantar aspect right heel at the insertional point of the tendon into the calcaneus with inflammation fluid buildup     Assessment:  Acute Planter fasciitis right     Plan:  Sterile prep injected the fascia 3 mg Kenalog 5 mg Xylocaine and instructed on reduced activity and fascial brace usage.  Patient will also utilize shoe gear modifications  X-rays indicate small spur no indication stress fracture arthritis and I did do this injection from the lateral side due to the proximity of pain to the central and lateral portion of the fascia

## 2021-02-26 ENCOUNTER — Ambulatory Visit: Payer: BC Managed Care – PPO | Admitting: Podiatry

## 2021-03-07 ENCOUNTER — Ambulatory Visit: Payer: BC Managed Care – PPO | Admitting: Podiatry

## 2021-04-18 ENCOUNTER — Ambulatory Visit: Payer: BC Managed Care – PPO | Admitting: Podiatry

## 2021-05-02 ENCOUNTER — Ambulatory Visit: Payer: BC Managed Care – PPO | Admitting: Podiatry

## 2023-09-21 ENCOUNTER — Emergency Department (HOSPITAL_BASED_OUTPATIENT_CLINIC_OR_DEPARTMENT_OTHER)

## 2023-09-21 ENCOUNTER — Other Ambulatory Visit: Payer: Self-pay

## 2023-09-21 ENCOUNTER — Emergency Department (HOSPITAL_COMMUNITY)
Admission: EM | Admit: 2023-09-21 | Discharge: 2023-09-21 | Disposition: A | Discharge status: Home / Self Care | Attending: Emergency Medicine | Admitting: Emergency Medicine

## 2023-09-21 DIAGNOSIS — M25461 Effusion, right knee: Secondary | ICD-10-CM | POA: Insufficient documentation

## 2023-09-21 DIAGNOSIS — M79661 Pain in right lower leg: Secondary | ICD-10-CM | POA: Diagnosis not present

## 2023-09-21 DIAGNOSIS — M79604 Pain in right leg: Secondary | ICD-10-CM | POA: Diagnosis present

## 2023-09-21 MED ORDER — GABAPENTIN 100 MG PO CAPS: 100.0000 mg | ORAL_CAPSULE | Freq: Three times a day (TID) | ORAL | 0 refills | Status: AC | PRN

## 2023-09-21 NOTE — ED Triage Notes (Signed)
 Pt. Stated, I went to [facility] and they sent me here thinking I might have a blood clot. Ive got rt. Leg pain my knee to my groin. This started 4 days ago.

## 2023-09-21 NOTE — Progress Notes (Signed)
 RLE venous duplex have been completed.  Preliminary report given to Dr. Adela Lank.    Results can be found under chart review under CV PROC. 09/21/2023 11:07 AM Natacia Chaisson RVT, RDMS

## 2023-09-21 NOTE — ED Provider Notes (Signed)
 [place] EMERGENCY DEPARTMENT AT [facility] Provider Note   CSN: [id number] Arrival date & time: 09/21/23  7829     History  Chief Complaint  Patient presents with   Leg Pain    Jonathan Collins is a 48 y.o. male.  The history is provided by the patient and medical records. No language interpreter was used.  Leg Pain    Patient is a 48 y.o. male with no significant PMH coming in to the ER today after being sent from Urgent Care for possible DVT workup. Patient states that he has been experiencing right leg pain that radiates from his groin down to his knee for the last 4-5 days. He states that the pain is constant and he describes it as a nagging, aching pain that is 5/10 on the pain scale. He also mentions that he will get random sharp, stabbing pains in his leg that are "debilitating and make my knee buckle". He rates this sharp pain a 10/10 when it occurs. Pt states that he has been trying to "ignore" the pain and treat it with ice, heat, and icy hot but this only temporarily relieves the pain. He states that today he went to urgent care to be evaluated because his wife and daughter noticed some minor swelling above his right knee and they were concerned. He denies any erythema or tenderness to the area or prior injury. He also denies calf swelling, tenderness, CP, SOB, or palpitations.  Home Medications Prior to Admission medications   Medication Sig Start Date End Date Taking? Authorizing Provider  diclofenac (VOLTAREN) 75 MG EC tablet Take 1 tablet (75 mg total) by mouth 2 (two) times daily. 02/12/21   Lenn Sink, DPM  fluorouracil (EFUDEX) 5 % cream Apply topically 2 (two) times daily. 09/18/20   Lenn Sink, DPM      Allergies    Penicillins    Review of Systems   Review of Systems  All other systems reviewed and are negative.   Physical Exam Updated Vital Signs BP (!) 148/90 (BP Location: Right Arm)   Pulse (!) 109   Temp 97.9 F (36.6 C)   Resp  18   Ht 6\' 4"  (1.93 m)   Wt 129.3 kg   SpO2 100%   BMI 34.69 kg/m  Physical Exam Constitutional:      General: He is not in acute distress.    Appearance: He is well-developed.  HENT:     Head: Atraumatic.  Eyes:     Conjunctiva/sclera: Conjunctivae normal.  Cardiovascular:     Rate and Rhythm: Normal rate and regular rhythm.     Pulses: Normal pulses.     Heart sounds: Normal heart sounds.  Pulmonary:     Effort: Pulmonary effort is normal.     Breath sounds: Normal breath sounds.  Abdominal:     Palpations: Abdomen is soft.  Musculoskeletal:        General: Tenderness (Right leg: Mild tenderness to right inguinal region and right knee on palpation.  Leg compartment is soft, dorsalis pedis pulse palpable, brisk cap refill, ambulate without difficulty.) present.     Cervical back: Normal range of motion and neck supple.  Skin:    Findings: No rash.  Neurological:     Mental Status: He is alert.     ED Results / Procedures / Treatments   Labs (all labs ordered are listed, but only abnormal results are displayed) Labs Reviewed - No data to display  EKG None  Radiology VAS [place] LOWER EXTREMITY VENOUS (DVT) (ONLY [facility] & [facility]) Result Date: 09/21/2023  Lower Venous DVT Study Patient Name:  Jonathan Collins  Date of Exam:   09/21/2023 Medical Rec #: [id number]      Accession #:    [id number] Date of Birth: Oct 14, 1975      Patient Gender: M Patient Age:   28 years Exam Location:  [facility] Procedure:      VAS [place] LOWER EXTREMITY VENOUS (DVT) Referring Phys: Lynden Oxford --------------------------------------------------------------------------------  Indications: Pain, and Swelling.  Risk Factors: History of DVT in mother and grandmother. Comparison Study: No previous exams Performing Technologist: Jody Hill RVT, RDMS  Examination Guidelines: A complete evaluation includes B-mode imaging, spectral Doppler, color Doppler, and power Doppler as needed of all accessible portions of  each vessel. Bilateral testing is considered an integral part of a complete examination. Limited examinations for reoccurring indications may be performed as noted. The reflux portion of the exam is performed with the patient in reverse Trendelenburg.  +---------+---------------+---------+-----------+----------+--------------+ RIGHT    CompressibilityPhasicitySpontaneityPropertiesThrombus Aging +---------+---------------+---------+-----------+----------+--------------+ CFV      Full           Yes      Yes                                 +---------+---------------+---------+-----------+----------+--------------+ SFJ      Full                                                        +---------+---------------+---------+-----------+----------+--------------+ FV Prox  Full           Yes      Yes                                 +---------+---------------+---------+-----------+----------+--------------+ FV Mid   Full           Yes      Yes                                 +---------+---------------+---------+-----------+----------+--------------+ FV DistalFull           Yes      Yes                                 +---------+---------------+---------+-----------+----------+--------------+ PFV      Full                                                        +---------+---------------+---------+-----------+----------+--------------+ POP      Full           Yes      Yes                                 +---------+---------------+---------+-----------+----------+--------------+ PTV      Full                                                        +---------+---------------+---------+-----------+----------+--------------+  PERO     Full                                                        +---------+---------------+---------+-----------+----------+--------------+   +----+---------------+---------+-----------+----------+--------------+  LEFTCompressibilityPhasicitySpontaneityPropertiesThrombus Aging +----+---------------+---------+-----------+----------+--------------+ CFV Full           Yes      Yes                                 +----+---------------+---------+-----------+----------+--------------+     Summary: RIGHT: - There is no evidence of deep vein thrombosis in the lower extremity.  - No cystic structure found in the popliteal fossa. - Ultrasound characteristics of enlarged lymph nodes are noted in the groin.  LEFT: - No evidence of common femoral vein obstruction.   *See table(s) above for measurements and observations.    Preliminary     Procedures Procedures    Medications Ordered in ED Medications - No data to display  ED Course/ Medical Decision Making/ A&P Clinical Course as of 09/21/23 1140  Tue Sep 21, 2023  1134 VAS [place] LOWER EXTREMITY VENOUS (DVT) (ONLY [facility] & [facility]) [RT]    Clinical Course User Index [RT] [place], Windy Fast, Student-PA                                 Medical Decision Making  BP (!) 148/90 (BP Location: Right Arm)   Pulse (!) 109   Temp 97.9 F (36.6 C)   Resp 18   Ht 6\' 4"  (1.93 m)   Wt 129.3 kg   SpO2 100%   BMI 34.69 kg/m   11:44 AM Patient is a 48 y.o. male with no significant PMH coming in to the ER today after being sent from Urgent Care for possible DVT workup. Patient states that he has been experiencing right leg pain that radiates from his groin down to his knee for the last 4-5 days. He states that the pain is constant and he describes it as a nagging, aching pain that is 5/10 on the pain scale. He also mentions that he will get random sharp, stabbing pains in his leg that are "debilitating and make my knee buckle". He rates this sharp pain a 10/10 when it occurs. Pt states that he has been trying to "ignore" the pain and treat it with ice, heat, and icy hot but this only temporarily relieves the pain. He states that today he went to urgent care to be  evaluated because his wife and daughter noticed some minor swelling above his right knee and they were concerned. He denies any erythema or tenderness to the area or prior injury. He also denies calf swelling, tenderness, CP, SOB, or palpitations.  Exam notable for mild tenderness to right inguinal region and right knee with full range of motion.  Patient is neurovascular intact.  Scrotal exam was not performed.  Vascular ultrasound obtained in ED reviewed by me without any evidence of DVT.  It was noted that patient has ultrasound characteristic of enlarged lymph nodes noted in the groin.  Patient without any GU complaint.  Perhaps lymph nodes may contribute to his symptoms.  Gave patient return precaution.  Final Clinical Impression(s) / ED Diagnoses Final diagnoses:  None    Rx / DC Orders ED Discharge Orders          Ordered    gabapentin (NEURONTIN) 100 MG capsule  3 times daily PRN        09/21/23 1141              Fayrene Helper, PA-C 09/21/23 1145    Melene Plan, DO 09/21/23 1146

## 2023-09-21 NOTE — Discharge Instructions (Signed)
 You have been evaluated for your symptoms.  Fortunately ultrasound today did not show any evidence of blood clot.  You may continue to take over-the-counter medication as needed for your symptom control and you may take gabapentin as needed for sharp shooting pain.

## 2023-09-21 NOTE — ED Provider Triage Note (Signed)
 Emergency Medicine Provider Triage Evaluation Note  Jonathan Collins , a 48 y.o. male  was evaluated in triage.  Pt complains of 5 days of right leg pain and new swelling.  Sent from PCP to rule out DVT.  Review of Systems  Positive: Family history in mother and grandparents for thromboembolic disease and clots.  Patient has pain and swelling in right leg. Negative: No chest pain, shortness of breath, palpitations, left leg symptoms, denies girdle pain, testicle pain, or penile pain.  Denies urinary symptoms or trauma.  Physical Exam  BP (!) 148/90 (BP Location: Right Arm)   Pulse (!) 109   Temp 97.9 F (36.6 C)   Resp 18   Ht 6\' 4"  (1.93 m)   Wt 129.3 kg   SpO2 100%   BMI 34.69 kg/m  Gen:   Awake, no distress   Resp:  Normal effort  MSK:   Moves extremities without difficulty, has some pain and soreness in right leg medially from the groin down towards the knee.  It was not tender on exam.   Other:  Slight edema in the right leg compared to left.  Patient had pulses found on Doppler in the PT and DP arteries on the right leg.  No focal tenderness on the leg  Medical Decision Making  Medically screening exam initiated at 9:38 AM.  Appropriate orders placed.  Jonathan Collins was informed that the remainder of the evaluation will be completed by another provider, this initial triage assessment does not replace that evaluation, and the importance of remaining in the ED until their evaluation is complete.  Jonathan Collins is a 48 y.o. male with a past medical history significant for asthma who presents with right leg pain and swelling.  According to patient, for the last 4 to 5 days he has had pain and swelling to the right leg.  He is concerned because his grandparent had a DVT and that his mother just had a stroke from a paroxysmal DVT that went through a PFO.  Patient says that he has had no recent trauma and he has had no long drives or flights.  He reports legs slightly swollen and is sore.  He  thought it was just muscle pains but the persistence despite doing topical treatments has continued to worsen.  Otherwise he denies any chest pain, shortness of breath, palpitations.  Denies syncope.  Denies any heavy lifting or abdominal pain that might indicate hernia.  He denies any discoloration of the leg and he does not feel cool.  On exam, he had DP and PT pulse via Doppler on the right leg.  It was slightly swollen.  It was not focally tender.  No rash seen.  It is not cool to the touch.  Chest nontender.  Lungs clear.  Back and flanks nontender.  Patient otherwise well-appearing.  Given his dopplerable pulses, close patient for an arterial etiology so we will hold on initial labs or CTA runoff imaging.  Will get DVT ultrasound of the leg to look for DVT first.  Anticipate further evaluation when he gets seen by provider but will order the ultrasound now.    Dorinda Stehr, Canary Brim, MD 09/21/23 [phone]

## 2023-12-24 ENCOUNTER — Encounter: Payer: Self-pay | Admitting: Podiatry

## 2023-12-24 ENCOUNTER — Ambulatory Visit: Admitting: Podiatry

## 2023-12-24 DIAGNOSIS — B070 Plantar wart: Secondary | ICD-10-CM | POA: Diagnosis not present

## 2023-12-27 NOTE — Progress Notes (Signed)
 Subjective:   Patient ID: Jonathan Collins, male   DOB: 48 y.o.   MRN: [id number]   HPI Patient presents with a painful wart on the right big toe that is been very sore and has had a history of this a degree of years ago   ROS      Objective:  Physical Exam  Neurovascular status intact with keratotic lesion plantar right hallux that upon debridement shows pinpoint bleeding and pain to lateral pressure     Assessment:  Verruca plantaris plantar right deep and painful     Plan:  H&P reviewed different treatment options and he is decided he wants excision.  It is measuring about 7 x 7 mm today I did an anesthetic block of the right hallux sterile prep done and using sterile instrumentation I excised the mass and applied phenol to the base.  Applied sterile dressing reappoint to recheck and understands it may regrow again and have to be treated again in the future
